# Patient Record
Sex: Male | Born: 1985 | Race: Black or African American | Hispanic: No | Marital: Single | Smoking: Never smoker
Health system: Southern US, Community
[De-identification: ages and names within clinical notes are randomized; demographics above are authoritative.]

## PROBLEM LIST (undated history)

## (undated) DIAGNOSIS — E7800 Pure hypercholesterolemia, unspecified: Secondary | ICD-10-CM

## (undated) DIAGNOSIS — E785 Hyperlipidemia, unspecified: Secondary | ICD-10-CM

## (undated) DIAGNOSIS — E669 Obesity, unspecified: Secondary | ICD-10-CM

## (undated) HISTORY — DX: Obesity, unspecified: E66.9

## (undated) HISTORY — DX: Hyperlipidemia, unspecified: E78.5

---

## 2009-11-17 ENCOUNTER — Ambulatory Visit: Payer: Self-pay | Admitting: Cardiology

## 2009-11-17 ENCOUNTER — Encounter: Payer: Self-pay | Admitting: Physician Assistant

## 2009-11-17 ENCOUNTER — Inpatient Hospital Stay (HOSPITAL_COMMUNITY): Admission: EM | Admit: 2009-11-17 | Discharge: 2009-11-19 | Payer: Self-pay | Admitting: Emergency Medicine

## 2009-11-18 ENCOUNTER — Encounter: Payer: Self-pay | Admitting: Physician Assistant

## 2009-11-22 ENCOUNTER — Encounter: Payer: Self-pay | Admitting: Physician Assistant

## 2009-11-22 DIAGNOSIS — E785 Hyperlipidemia, unspecified: Secondary | ICD-10-CM

## 2009-11-22 DIAGNOSIS — E119 Type 2 diabetes mellitus without complications: Secondary | ICD-10-CM | POA: Insufficient documentation

## 2009-11-22 DIAGNOSIS — E669 Obesity, unspecified: Secondary | ICD-10-CM

## 2009-11-30 ENCOUNTER — Ambulatory Visit: Payer: Self-pay | Admitting: Internal Medicine

## 2009-11-30 ENCOUNTER — Encounter: Payer: Self-pay | Admitting: Physician Assistant

## 2009-11-30 LAB — CONVERTED CEMR LAB
ALT: 39 units/L (ref 0–53)
Albumin: 4.3 g/dL (ref 3.5–5.2)
Blood Glucose, Fingerstick: 147
CO2: 24 meq/L (ref 19–32)
Chloride: 102 meq/L (ref 96–112)
Glucose, Bld: 144 mg/dL — ABNORMAL HIGH (ref 70–99)
Potassium: 4.4 meq/L (ref 3.5–5.3)
Sodium: 139 meq/L (ref 135–145)
Total Bilirubin: 0.5 mg/dL (ref 0.3–1.2)
Total Protein: 7 g/dL (ref 6.0–8.3)

## 2009-12-01 ENCOUNTER — Encounter: Payer: Self-pay | Admitting: Physician Assistant

## 2009-12-02 ENCOUNTER — Encounter: Payer: Self-pay | Admitting: Physician Assistant

## 2010-04-27 ENCOUNTER — Ambulatory Visit: Payer: Self-pay | Admitting: Physician Assistant

## 2010-04-27 LAB — CONVERTED CEMR LAB
Bilirubin Urine: NEGATIVE
Blood Glucose, Fingerstick: 169
Blood in Urine, dipstick: NEGATIVE
Glucose, Urine, Semiquant: NEGATIVE
Ketones, urine, test strip: NEGATIVE
Protein, U semiquant: NEGATIVE
WBC Urine, dipstick: NEGATIVE

## 2010-04-29 LAB — CONVERTED CEMR LAB
ALT: 28 units/L (ref 0–53)
AST: 20 units/L (ref 0–37)
Alkaline Phosphatase: 56 units/L (ref 39–117)
Basophils Absolute: 0.1 10*3/uL (ref 0.0–0.1)
CO2: 22 meq/L (ref 19–32)
Creatinine, Ser: 1.11 mg/dL (ref 0.40–1.50)
HCT: 47.4 % (ref 39.0–52.0)
Hemoglobin: 15.5 g/dL (ref 13.0–17.0)
Hgb A1c MFr Bld: 8.2 % — ABNORMAL HIGH (ref <5.7)
Lymphocytes Relative: 40 % (ref 12–46)
Lymphs Abs: 2.5 10*3/uL (ref 0.7–4.0)
Neutro Abs: 3.1 10*3/uL (ref 1.7–7.7)
Platelets: 273 10*3/uL (ref 150–400)
Potassium: 4.5 meq/L (ref 3.5–5.3)
RDW: 14 % (ref 11.5–15.5)
Sodium: 139 meq/L (ref 135–145)
Total Bilirubin: 0.5 mg/dL (ref 0.3–1.2)
Total Protein: 7.6 g/dL (ref 6.0–8.3)
WBC: 6.3 10*3/uL (ref 4.0–10.5)

## 2010-05-02 ENCOUNTER — Telehealth: Payer: Self-pay | Admitting: Physician Assistant

## 2010-05-03 ENCOUNTER — Ambulatory Visit: Payer: Self-pay | Admitting: Physician Assistant

## 2010-05-31 ENCOUNTER — Ambulatory Visit: Payer: Self-pay | Admitting: Physician Assistant

## 2010-07-08 ENCOUNTER — Encounter: Payer: Self-pay | Admitting: Physician Assistant

## 2010-07-28 ENCOUNTER — Ambulatory Visit: Payer: Self-pay | Admitting: Physician Assistant

## 2010-07-28 LAB — CONVERTED CEMR LAB: Blood Glucose, Fingerstick: 245

## 2010-08-09 ENCOUNTER — Encounter (INDEPENDENT_AMBULATORY_CARE_PROVIDER_SITE_OTHER): Payer: Self-pay | Admitting: *Deleted

## 2010-08-12 ENCOUNTER — Ambulatory Visit: Payer: Self-pay | Admitting: Internal Medicine

## 2010-08-15 LAB — CONVERTED CEMR LAB: HDL: 46 mg/dL (ref >39)

## 2010-09-09 ENCOUNTER — Telehealth (INDEPENDENT_AMBULATORY_CARE_PROVIDER_SITE_OTHER): Payer: Self-pay | Admitting: Internal Medicine

## 2010-09-09 ENCOUNTER — Encounter (INDEPENDENT_AMBULATORY_CARE_PROVIDER_SITE_OTHER): Payer: Self-pay | Admitting: *Deleted

## 2010-09-20 ENCOUNTER — Ambulatory Visit: Payer: Self-pay | Admitting: Internal Medicine

## 2010-11-22 NOTE — Assessment & Plan Note (Signed)
Summary: type 2 dm/ gk   Vital Signs:  Patient profile:   25 year old male Height:      71 inches Weight:      272.5 pounds BMI:     38.14 Temp:     97.7 degrees F oral Pulse rate:   75 / minute Pulse rhythm:   regular Resp:     18 per minute BP sitting:   126 / 91  (left arm) Cuff size:   large  Vitals Entered By: [place] Shannon (April 27, 2010 9:08 AM) CC: follow-up visit.... metformin is making pt go to bathroom alot... (diarrhea).... Is Patient Diabetic? Yes Pain Assessment Patient in pain? no      CBG Result 169  Does patient need assistance? Ambulation Normal   Primary Care Provider:  Tereso Newcomer PA-C  CC:  follow-up visit.... metformin is making pt go to bathroom alot... (diarrhea).....  History of Present Illness: Here for f/u on DM.  Not seen since Feb.  No f/u arranged until now.  DM:  Sugars at home 130-170.  Had one reading over 200 in last several months.  No polyuria or polydipsia.  No foot problems.  Needs retasure.  Having issues with Metformin.  He states that he is having diarrhea every time he takes the medicine.  He has not been taking on a regular basis.  Actually cut back to once daily a few months ago.  There are some days he does not take.  He states there are more days a week that he does NOT take the Metformin.  Still using Lantus 40 units at bedtime and Novolog SSI.  Uses 6 units + sliding scale given to him in the hospital.  No hypoglycemic episodes.  High chol:  Still taking Niacin and SImvastatin 40.  Fasting today.   Allergies (verified): No Known Drug Allergies  Comments:  Nurse/Medical Assistant: refill on insulin.... pt did not bring meds today and he is advise to bring meds to every appointment Armenia Shannon (April 27, 2010 9:20 AM)  Review of Systems      See HPI General:  Denies chills and fever. Eyes:  Denies blurring. CV:  Denies chest pain or discomfort, fainting, and shortness of breath with exertion. Resp:  Denies  cough. GI:  See HPI.  Physical Exam  General:  alert, well-developed, and well-nourished.   Head:  normocephalic and atraumatic.   Eyes:  pupils equal, pupils round, pupils reactive to light, and no optic disk abnormalities.   Neck:  supple.   Lungs:  normal breath sounds.   Heart:  normal rate and regular rhythm.   Msk:  no foot deformity  Extremities:  no edema bilat feet without callus or ulcer  Neurologic:  alert & oriented X3 and cranial nerves II-XII intact.   Psych:  normally interactive.    Diabetes Management Exam:    Foot Exam (with socks and/or shoes not present):       Sensory-Monofilament:          Left foot: normal          Right foot: normal   Impression & Recommendations:  Problem # 1:  DIABETES MELLITUS, TYPE II (ICD-250.00) decrease metformin to 1/2 tab two times a day if tolerates, continue 500 mg two times a day if not, d/c completely  His updated medication list for this problem includes:    Lantus 100 Unit/ml Soln (Insulin glargine) ..... Inject 40 units at bedtime    Metformin  Hcl 500 Mg Tabs (Metformin hcl) .Marland Kitchen... Take 1 tablet by mouth two times a day    Novolog 100 Unit/ml Soln (Insulin aspart) ..... Inject 6 units with each meal plus extra as indicated by sliding scale (resistant scale)  Orders: T-Comprehensive Metabolic Panel ([address]-29562) T-Urine Microalbumin w/creat. ratio [id number]) UA Dipstick w/o Micro (manual) (52841) Capillary Blood Glucose/CBG (82948) T- Hemoglobin A1C (32440-10272)  Problem # 2:  HYPERLIPIDEMIA (ICD-272.4) recheck FLP today  His updated medication list for this problem includes:    Simvastatin 40 Mg Tabs (Simvastatin) .Marland Kitchen... Take 1 tab by mouth at bedtime for cholesterol    Niacin Cr 500 Mg Cr-tabs (Niacin) .Marland Kitchen... Take 1 tab by mouth at bedtime for cholesterol    Niacin Cr 1000 Mg Cr-tabs (Niacin) .Marland Kitchen... Take 1 tab by mouth at bedtime for cholesterol.  do not start until after you finish the prescription for  the 500 mg tablets.  Orders: T-Comprehensive Metabolic Panel [phone]) T-Lipid Profile [phone])  Problem # 3:  OBESITY (ICD-278.00) see Susie Piper  Complete Medication List: 1)  Lantus 100 Unit/ml Soln (Insulin glargine) .... Inject 40 units at bedtime 2)  Metformin Hcl 500 Mg Tabs (Metformin hcl) .... Take 1 tablet by mouth two times a day 3)  Novolog 100 Unit/ml Soln (Insulin aspart) .... Inject 6 units with each meal plus extra as indicated by sliding scale (resistant scale) 4)  Simvastatin 40 Mg Tabs (Simvastatin) .... Take 1 tab by mouth at bedtime for cholesterol 5)  Multivitamins Tabs (Multiple vitamin) .... Take 1 tablet by mouth once a day 6)  Insulin Syringes  .... Inject lantus once daily and novolog three times a day as directed 7)  Niacin Cr 500 Mg Cr-tabs (Niacin) .... Take 1 tab by mouth at bedtime for cholesterol 8)  Niacin Cr 1000 Mg Cr-tabs (Niacin) .... Take 1 tab by mouth at bedtime for cholesterol.  do not start until after you finish the prescription for the 500 mg tablets.  Patient Instructions: 1)  [facility] clinic. 2)  Decrease Metformin to 1000 mg take 1/2 tablet two times a day. 3)  If you still have diarrhea with this, stop it completely. 4)  Notify me what you do with the Metformin. 5)  Schedule appointment with Drucilla Schmidt for diet education. 6)  Please schedule a follow-up appointment in 3 months with Jarmarcus Wambold for diabetes.  Prescriptions: NOVOLOG 100 UNIT/ML SOLN (INSULIN ASPART) inject 6 units with each meal plus extra as indicated by sliding scale (resistant scale)  #1 mo. supply x 11   Entered and Authorized by:   Tereso Newcomer PA-C   Signed by:   Tereso Newcomer PA-C on 04/27/2010   Method used:   Print then Give to Patient   RxID:   [id number] LANTUS 100 UNIT/ML SOLN (INSULIN GLARGINE) inject 40 units at bedtime  #1 mo supply x 11   Entered and Authorized by:   Tereso Newcomer PA-C   Signed by:   Tereso Newcomer PA-C on  04/27/2010   Method used:   Print then Give to Patient   RxID:   [id number] METFORMIN HCL 500 MG TABS (METFORMIN HCL) Take 1 tablet by mouth two times a day  #60 x 5   Entered and Authorized by:   Tereso Newcomer PA-C   Signed by:   Tereso Newcomer PA-C on 04/27/2010   Method used:   Print then Give to Patient   RxID:   [id number]  Diabetic Foot Exam Last Podiatry Exam Date: 04/27/2010  Foot Inspection Is there a history of a foot ulcer?              No Is there a foot ulcer now?              No Can the patient see the bottom of their feet?          Yes Are the shoes appropriate in style and fit?          Yes Is there swelling or an abnormal foot shape?          No Are the toenails long?                No Are the toenails thick?                No Are the toenails ingrown?              No Is there heavy callous build-up?              No Is there a claw toe deformity?                          No Is there elevated skin temperature?            No Is there limited ankle dorsiflexion?            No Is there foot or ankle muscle weakness?            No Do you have pain in calf while walking?           No         10-g (5.07) Semmes-Weinstein Monofilament Test Performed by: Armenia Shannon          Right Foot          Left Foot Visual Inspection               Test Control      normal         normal Site 1         normal         normal Site 2         normal         normal Site 3         normal         normal Site 4         normal         normal Site 5         normal         normal Site 6         normal         normal Site 7         normal         normal Site 8         normal         normal Site 9         normal         normal Site 10         normal         normal  Impression      normal         normal  Laboratory Results   Urine Tests    Routine Urinalysis   Glucose: negative   (Normal Range: Negative) Bilirubin:  negative   (Normal Range:  Negative) Ketone: negative   (Normal Range: Negative) Spec. Gravity: 1.025   (Normal Range: 1.003-1.035) Blood: negative   (Normal Range: Negative) pH: 5.0   (Normal Range: 5.0-8.0) Protein: negative   (Normal Range: Negative) Urobilinogen: 0.2   (Normal Range: 0-1) Nitrite: negative   (Normal Range: Negative) Leukocyte Esterace: negative   (Normal Range: Negative)     Blood Tests     CBG Random:: 169mg /dL

## 2010-11-22 NOTE — Letter (Signed)
Summary: NUTRITIONIST SUMMARY  NUTRITIONIST SUMMARY   Imported By: Arta Bruce 06/20/2010 16:51:02  _____________________________________________________________________  External Attachment:    Type:   Image     Comment:   External Document

## 2010-11-22 NOTE — Letter (Signed)
Summary: *HSN Results Follow up  [facility]  [address] [place], [place] [address]   Phone: [phone]  Fax: [phone]      09/09/2010   Anish ERIC Mccready [address] [place], [place]  [address]   Dear  Mr. SIEGFRIED VIETH,                            ____S.Drinkard,FNP   ____D. Gore,FNP       ____B. McPherson,MD   ____V. Rankins,MD    __X__E. Mulberry,MD    ____N. Daphine Deutscher, FNP  ____D. Reche Dixon, MD    ____K. Philipp Deputy, MD    ____Other     This letter is to inform you that your recent test(s):  _______Pap Smear    _______Lab Test     _______X-ray    _______ is within acceptable limits  _______ requires a medication change  _______ requires a follow-up lab visit  _______ requires a follow-up visit with your provider   Comments: I BEEN TRYING TO REACH AND THE NUTRIRIONIST CLINIC TOO .                   PLEASE CALL [phone] TO MAKE AN APPT WITH THE NUTRITIONIST                   THANK YOU        _________________________________________________________ If you have any questions, please contact our office                     Sincerely,  Cheryll Dessert [facility]

## 2010-11-22 NOTE — Letter (Signed)
Summary: *HSN Results Follow up  [facility]  [address] [place], [place] [address]   Phone: [phone]  Fax: [phone]      07/08/2010   Vonnie ERIC Priestly [address] [place], [place]  [address]   Dear  Mr. GERSHOM BROBECK,                            ____S.Drinkard,FNP   ____D. Gore,FNP       ____B. McPherson,MD   ____V. Rankins,MD    ____E. Mulberry,MD    ____N. Daphine Deutscher, FNP  ____D. Reche Dixon, MD    ____K. Philipp Deputy, MD    __x__S. Alben Spittle, PA-C     This letter is to inform you that your recent test(s):  _______Pap Smear    _______Lab Test     _______X-ray    _______ is within acceptable limits  _______ requires a medication change  _______ requires a follow-up lab visit  _______ requires a follow-up visit with your Neida Ellegood   Comments:Eye test for diabetes was normal.       _________________________________________________________ If you have any questions, please contact our office                     Sincerely,  Tereso Newcomer PA-C [facility]

## 2010-11-22 NOTE — Letter (Signed)
Summary: *HSN Results Follow up  [facility]  [address] [place], [place] [address]   Phone: [phone]  Fax: [phone]      12/02/2009   Ulyses ERIC Hebel [address] [place], [place]  [address]   Dear  Mr. KHOLTON COATE,                            ____S.Drinkard,FNP   ____D. Gore,FNP       ____B. McPherson,MD   ____V. Rankins,MD    ____E. Mulberry,MD    ____N. Daphine Deutscher, FNP  ____D. Reche Dixon, MD    ____K. Philipp Deputy, MD    __x__S. Alben Spittle, PA-C     This letter is to inform you that your recent test(s):  _______Pap Smear    ____x___Lab Test     _______X-ray    ___x____ is within acceptable limits  _______ requires a medication change  _______ requires a follow-up lab visit  _______ requires a follow-up visit with your provider   Comments:       _________________________________________________________ If you have any questions, please contact our office                     Sincerely,  Tereso Newcomer PA-C [facility]

## 2010-11-22 NOTE — Letter (Signed)
Summary: *HSN Results Follow up  [facility]  [address] [place], [place] [address]   Phone: [phone]  Fax: [phone]      08/09/2010   Ian Huff [address] [place], [place]  [address]   Dear  [name],                            ____S.Drinkard,FNP   ____D. Gore,FNP       ____B. McPherson,MD   ____V. Rankins,MD    ____E. Mulberry,MD    ____N. Daphine Deutscher, FNP  ____D. Reche Dixon, MD    ____K. Philipp Deputy, MD    ____Other     This letter is to inform you that your recent test(s):  _______Pap Smear    _______Lab Test     _______X-ray    _______ is within acceptable limits  _______ requires a medication change  _______ requires a follow-up lab visit  _______ requires a follow-up visit with your [name]   Comments:  We have been trying to reach you.  Please call the office at your earliest convenience.       _________________________________________________________ If you have any questions, please contact our office                     Sincerely,  [place] Shannon [facility]

## 2010-11-22 NOTE — Assessment & Plan Note (Signed)
Summary: XFU-NEW ONSET DM//DS   Vital Signs:  Patient profile:   25 year old male Height:      71 inches Weight:      257 pounds BMI:     35.97 Temp:     98.0 degrees F oral Pulse rate:   89 / minute Pulse rhythm:   regular Resp:     18 per minute BP sitting:   118 / 81  (left arm) Cuff size:   large  Vitals Entered By: [place] Shannon (November 30, 2009 12:08 PM) CC: xf/u dm.... pt says his eyes are blurry.... pt does need rx on meds... Is Patient Diabetic? Yes Pain Assessment Patient in pain? no      CBG Result 147  Does patient need assistance? Functional Status Self care Ambulation Normal   CC:  xf/u dm.... pt says his eyes are blurry.... pt does need rx on meds....  History of Present Illness: 25 year old male who presents for post physician followup.  He presented with new onset diabetes mellitus on 11/17/09.  He was noted to have diabetic ketoacidosis as well as acute renal insufficiency secondary to dehydration and oral thrush.  He had hyponatremia and hypokalemia related to his presenting situation.  With correction of his sugars and hydration his electrolytes improved as well as his creatinine.  He was placed on metformin as well as Lantus and NovoLog sliding scale insulin.  He had a anti-glutamic acid decarboxylase level obtained which was normal and indicated that he is a type II diabetic.  He was also noted to have significant dyslipidemia with triglyceride level over 1000 and a total cholesterol of 620.  He was placed on simvastatin as well as Fish oil.  The patient notes that he has been unable to take Fish oil secondary to the size of the tablets.  He has been out of his simvastatin and metformin for a few days now.  He didn't checking his sugars at home and they have been below 200 the majority of the time.  Since being home, he has felt well.  He denies any further dry mouth or polyuria.  He was treated for oral thrush and the lesions inside his mouth clear.  He  denies nausea vomiting or diarrhea.  He understands that he uses sliding scale insulin.  Habits & Providers  Alcohol-Tobacco-Diet     Tobacco Status: never  Exercise-Depression-Behavior     Drug Use: no  Current Medications (verified): 1)  Lantus 100 Unit/ml Soln (Insulin Glargine) .... Inject 40 Units At Bedtime 2)  Metformin Hcl 1000 Mg Tabs (Metformin Hcl) .... Take 1 Tablet By Mouth Two Times A Day For Diabetes 3)  Novolog 100 Unit/ml Soln (Insulin Aspart) .... Inject 6 Units With Each Meal or As Directed Per Sliding Scale 4)  Simvastatin 40 Mg Tabs (Simvastatin) .... Take 1 Tab By Mouth At Bedtime For Cholesterol 5)  Lovaza 1 Gm Caps (Omega-3-Acid Ethyl Esters) .... Take 1 Tablet By Mouth Four Times A Day For Cholesterol 6)  Multivitamins  Tabs (Multiple Vitamin) .... Take 1 Tablet By Mouth Once A Day  Allergies (verified): No Known Drug Allergies  Family History: Reviewed history from 11/22/2009 and no changes required. Family History Diabetes 1st degree relative (mom)  Social History: Occupation: unemployed Was in school for bus. admin.; plans to go back Single Never Smoked Alcohol use-no Drug use-no Occupation:  employed Smoking Status:  never Drug Use:  no  Review of Systems      See  HPI General:  Denies chills and fever. Eyes:  Complains of blurring. CV:  Denies chest pain or discomfort. Resp:  Denies shortness of breath. GI:  Denies bloody stools. GU:  Denies hematuria.  Physical Exam  General:  alert, well-developed, and well-nourished.   Head:  normocephalic and atraumatic.   Eyes:  pupils equal, pupils round, pupils reactive to light, and no retinal abnormalitiies.   Mouth:  pharynx pink and moist.   Neck:  supple and no carotid bruits.   Lungs:  normal breath sounds, no crackles, and no wheezes.   Heart:  normal rate, regular rhythm, and no murmur.   Abdomen:  soft, non-tender, normal bowel sounds, and no hepatomegaly.   Neurologic:  alert &  oriented X3 and cranial nerves II-XII intact.   Psych:  normally interactive.     Impression & Recommendations:  Problem # 1:  DIABETES MELLITUS, TYPE II (ICD-250.00)  sliding scale is resistant scale from hosp . . . so he takes 6 units of novolog with each meal + sliding scale (if indicated) <120 . . . nothing 121-150. . . 3 units 151-200. . . 4 201-250. . . 7 251-300. . . 11 301-350. . . 15 351-400. . . 20 >400. . . call  A1C was 14.4 in the hosp. recheck again in 4-6 weeks schedule retasure . . . c/o blurry vision at times obtain microalbumin and monofilament at next visit    His updated medication list for this problem includes:    Lantus 100 Unit/ml Soln (Insulin glargine) ..... Inject 40 units at bedtime    Metformin Hcl 1000 Mg Tabs (Metformin hcl) .Marland Kitchen... Take 1 tablet by mouth two times a day for diabetes    Novolog 100 Unit/ml Soln (Insulin aspart) ..... Inject 6 units with each meal plus extra as indicated by sliding scale (resistant scale)  Orders: Diabetic Clinic Referral (Diabetic) T-Comprehensive Metabolic Panel ([id number])  Problem # 2:  HYPERLIPIDEMIA (ICD-272.4)  not taking lovaza due to size of pills change to niacin check labs at f/u visit advised him to take asa 30 mins before and warned him of possibility of flushing  The following medications were removed from the medication list:    Lovaza 1 Gm Caps (Omega-3-acid ethyl esters) .Marland Kitchen... Take 1 tablet by mouth four times a day for cholesterol His updated medication list for this problem includes:    Simvastatin 40 Mg Tabs (Simvastatin) .Marland Kitchen... Take 1 tab by mouth at bedtime for cholesterol    Niacin Cr 500 Mg Cr-tabs (Niacin) .Marland Kitchen... Take 1 tab by mouth at bedtime for cholesterol    Niacin Cr 1000 Mg Cr-tabs (Niacin) .Marland Kitchen... Take 1 tab by mouth at bedtime for cholesterol.  do not start until after you finish the prescription for the 500 mg tablets.  Orders: T-Comprehensive Metabolic Panel  ([id number])  Problem # 3:  Preventive Health Care (ICD-V70.0) flu, Td, Pnuemovax all up to date wants to hold off on getting HIV until next lab visit TSH in the hosp ok UDS was also ok CBC with normal hgb in hosp.  Complete Medication List: 1)  Lantus 100 Unit/ml Soln (Insulin glargine) .... Inject 40 units at bedtime 2)  Metformin Hcl 1000 Mg Tabs (Metformin hcl) .... Take 1 tablet by mouth two times a day for diabetes 3)  Novolog 100 Unit/ml Soln (Insulin aspart) .... Inject 6 units with each meal plus extra as indicated by sliding scale (resistant scale) 4)  Simvastatin 40 Mg Tabs (Simvastatin) .... Take 1  tab by mouth at bedtime for cholesterol 5)  Multivitamins Tabs (Multiple vitamin) .... Take 1 tablet by mouth once a day 6)  Insulin Syringes  .... Inject lantus once daily and novolog three times a day as directed 7)  Niacin Cr 500 Mg Cr-tabs (Niacin) .... Take 1 tab by mouth at bedtime for cholesterol 8)  Niacin Cr 1000 Mg Cr-tabs (Niacin) .... Take 1 tab by mouth at bedtime for cholesterol.  do not start until after you finish the prescription for the 500 mg tablets.  Patient Instructions: 1)  Please schedule appointment for eligibility. 2)  Schedule retasure at [facility] Clinic. 3)  Take Aspirin 81 mg once daily about 30 minutes before you take the niacin. 4)  Niacin will replace the fish oil tablets. 5)  Please schedule a follow-up appointment in 1 month with Hamad Whyte for diabetes. 6)  Schedule appt with Susie Piper. 7)    Prescriptions: NIACIN CR 1000 MG CR-TABS (NIACIN) Take 1 tab by mouth at bedtime for cholesterol.  Do not start until after you finish the prescription for the 500 mg tablets.  #30 x 5   Entered and Authorized by:   Tereso Newcomer PA-C   Signed by:   Tereso Newcomer PA-C on 11/30/2009   Method used:   Print then Give to Patient   RxID:   [id number] NIACIN CR 500 MG CR-TABS (NIACIN) Take 1 tab by mouth at bedtime for cholesterol  #30 x 0   Entered and  Authorized by:   Tereso Newcomer PA-C   Signed by:   Tereso Newcomer PA-C on 11/30/2009   Method used:   Print then Give to Patient   RxID:   [id number] INSULIN SYRINGES Inject Lantus once daily and novolog three times a day as directed  #1 mo. supply x 11   Entered and Authorized by:   Tereso Newcomer PA-C   Signed by:   Tereso Newcomer PA-C on 11/30/2009   Method used:   Print then Give to Patient   RxID:   [id number] MULTIVITAMINS  TABS (MULTIPLE VITAMIN) Take 1 tablet by mouth once a day  #30 x 11   Entered and Authorized by:   Tereso Newcomer PA-C   Signed by:   Tereso Newcomer PA-C on 11/30/2009   Method used:   Print then Give to Patient   RxID:   [id number] SIMVASTATIN 40 MG TABS (SIMVASTATIN) Take 1 tab by mouth at bedtime for cholesterol  #30 x 5   Entered and Authorized by:   Tereso Newcomer PA-C   Signed by:   Tereso Newcomer PA-C on 11/30/2009   Method used:   Print then Give to Patient   RxID:   [id number] NOVOLOG 100 UNIT/ML SOLN (INSULIN ASPART) inject 6 units with each meal plus extra as indicated by sliding scale (resistant scale)  #1 mo. supply x 11   Entered and Authorized by:   Tereso Newcomer PA-C   Signed by:   Tereso Newcomer PA-C on 11/30/2009   Method used:   Print then Give to Patient   RxID:   [id number] METFORMIN HCL 1000 MG TABS (METFORMIN HCL) Take 1 tablet by mouth two times a day for diabetes  #60 x 5   Entered and Authorized by:   Tereso Newcomer PA-C   Signed by:   Tereso Newcomer PA-C on 11/30/2009   Method used:   Print then Give to Patient   RxID:   [id number] LANTUS 100 UNIT/ML SOLN (INSULIN GLARGINE) inject 40 units  at bedtime  #1 mo supply x 11   Entered and Authorized by:   Tereso Newcomer PA-C   Signed by:   Tereso Newcomer PA-C on 11/30/2009   Method used:   Print then Give to Patient   RxID:   [id number]

## 2010-11-22 NOTE — Progress Notes (Signed)
Summary: NEEDS REFILL ON LANTUS & STRIPS  Phone Note Call from Patient Call back at Home Phone [phone]   Reason for Call: Refill Medication Summary of Call: Icholas Irby PT. MR Romig SAYS THAT HE HAS NEVER USED OUR PHARMACY, BUT HE NEED REFILL ON HIS LANTUS. AND HE NEEDS REFILL ON HIS TEST STRIPS (RELION STRIPS #20) THESE HE PURCHASED AT [facility] Initial call taken by: Leodis Rains,  May 02, 2010 2:35 PM  Follow-up for Phone Call        forward to provider Follow-up by: [place] Shannon,  May 03, 2010 8:41 AM  Additional Follow-up for Phone Call Additional follow up Details #1::        [facility] Additional Follow-up by: Tereso Newcomer PA-C,  May 03, 2010 9:05 PM    Additional Follow-up for Phone Call Additional follow up Details #2::    Left message on answering machine for pt to call back.Marland KitchenMarland KitchenArmenia Shannon  May 04, 2010 10:36 AM   [facility]  Follow-up by: Arm[place] Shannon,  May 04, 2010 4:03 PM  Additional Follow-up for Phone Call Additional follow up Details #3:: Details for Additional Follow-up Action Taken: Left message on voicemail to return call.  Dutch Quint RN  May 05, 2010 12:40 PM  Lantus called  to [facility], they do not carry Relion strips will need to find out which [facility]. Left message on answer machine for pt. to return call. Gaylyn Cheers RN  May 06, 2010 10:02 AM   Rx for strips [facility] per pt. request.     Additional Follow-up by: Dutch Quint RN,  May 06, 2010 11:36 AM  New/Updated Medications: RELION BLOOD GLUCOSE TEST  STRP (GLUCOSE BLOOD) check sugar three times a day Prescriptions: RELION BLOOD GLUCOSE TEST  STRP (GLUCOSE BLOOD) check sugar three times a day  #90 x 11   Entered and Authorized by:   Tereso Newcomer PA-C   Signed by:   Tereso Newcomer PA-C on 05/04/2010   Method used:   Faxed to ...       [facility]  (retail)       [address] [place], [place]  [address]       Ph: [phone]       Fax: [phone]   RxID:   [id number] LANTUS 100 UNIT/ML SOLN (INSULIN GLARGINE) inject 50 units at bedtime  #1 mo supply x 11   Entered and Authorized by:   Tereso Newcomer PA-C   Signed by:   Tereso Newcomer PA-C on 05/04/2010   Method used:   Faxed to ...       [facility] (retail)       [address] [place], [place]  [address]       Ph: [phone]       Fax: [phone]   RxID:   [id number]

## 2010-11-22 NOTE — Miscellaneous (Signed)
  Clinical Lists Changes  Observations: Added new observation of DIAB EYE EX: retasure normal (05/03/2010 15:01)

## 2010-11-22 NOTE — Letter (Signed)
Summary: PT INFORMATION SHEET  PT INFORMATION SHEET   Imported By: Arta Bruce 01/19/2010 15:03:50  _____________________________________________________________________  External Attachment:    Type:   Image     Comment:   External Document

## 2010-11-22 NOTE — Assessment & Plan Note (Signed)
Summary: Diabetes   Vital Signs:  Patient profile:   25 year old male Height:      71 inches Weight:      270.8 pounds BMI:     37.91 Temp:     98.2 degrees F oral Pulse rate:   76 / minute Pulse rhythm:   regular Resp:     18 per minute BP sitting:   126 / 80  (left arm) Cuff size:   regular  Vitals Entered By: CMA Student Linzie Collin CC: follow-up visit, DM, patient isn't fasting Is Patient Diabetic? Yes Pain Assessment Patient in pain? no      CBG Result 245  Does patient need assistance? Functional Status Self care Ambulation Normal   Primary Care Provider:  Tereso Newcomer PA-C  CC:  follow-up visit, DM, and patient isn't fasting.  History of Present Illness: Here for f/u on DM.  Diet is suspect.  Eats a lot of chips.  Eats at least every other day.  Does not eat fast food often.  Sugars on his meter run anywhere from 150-300.  Most avg somewhere around 190-220.  No nausea, blurry vision, polyuria or polydipsia.  Taking Lantus 50 units at bedtime.  Usually takes about 10 units of Novolog with each meal.  No foot sores or numbness or tingling.  Now off metformin.     Problems Prior to Update: 1)  Family History Diabetes 1st Degree Relative  (ICD-V18.0) 2)  Obesity  (ICD-278.00) 3)  Hyperlipidemia  (ICD-272.4) 4)  Diabetes Mellitus, Type II  (ICD-250.00)  Current Medications (verified): 1)  Lantus 100 Unit/ml Soln (Insulin Glargine) .... Inject 50 Units At Bedtime 2)  Metformin Hcl 500 Mg Tabs (Metformin Hcl) .... Take 1 Tablet By Mouth Two Times A Day 3)  Novolog 100 Unit/ml Soln (Insulin Aspart) .... Inject 6 Units With Each Meal Plus Extra As Indicated By Sliding Scale (Resistant Scale) 4)  Simvastatin 40 Mg Tabs (Simvastatin) .... Take 1 Tab By Mouth At Bedtime For Cholesterol 5)  Multivitamins  Tabs (Multiple Vitamin) .... Take 1 Tablet By Mouth Once A Day 6)  Insulin Syringes .... Inject Lantus Once Daily and Novolog Three Times A Day As Directed 7)   Niacin Cr 500 Mg Cr-Tabs (Niacin) .... Take 1 Tab By Mouth At Bedtime For Cholesterol 8)  Niacin Cr 1000 Mg Cr-Tabs (Niacin) .... Take 1 Tab By Mouth At Bedtime For Cholesterol.  Do Not Start Until After You Finish The Prescription For The 500 Mg Tablets. 9)  Relion Blood Glucose Test  Strp (Glucose Blood) .... Check Sugar Three Times A Day  Allergies (verified): No Known Drug Allergies  Past History:  Past Medical History: Last updated: 11/22/2009 Diabetes mellitus, type II   a.  Admx with DKA 10/2009 with ARF, dehydration and hypoNa assoc.   b.  Anti-glutamic decarboxylase Abs negative Hyperlipidemia   a. TC 620; TRIG 1765   b.  Simvastatin 40 and Lovaza 1 gm 4x/day started at d/c 10/2009 from [facility]   c.  f/u FLP and LFTs needed late Feb or early Mar 2011 Obesity  Physical Exam  General:  alert, well-developed, and well-nourished.   Head:  normocephalic and atraumatic.   Eyes:  pupils equal, pupils round, and pupils reactive to light.   Lungs:  normal breath sounds.   Heart:  normal rate and regular rhythm.   Abdomen:  soft, non-tender, normal bowel sounds, and no hepatomegaly.   Neurologic:  alert & oriented X3 and cranial nerves  II-XII intact.   Psych:  normally interactive.    Diabetes Management Exam:    Foot Exam (with socks and/or shoes not present):       Inspection:          Left foot: normal          Right foot: normal       Nails:          Left foot: normal          Right foot: normal   Impression & Recommendations:  Problem # 1:  DIABETES MELLITUS, TYPE II (ICD-250.00) sugars appear uncontrolled diet is suspect advised him to watch diet increase Lantus to 60 units at bedtime check A1C may need to consider adding Actos or Januvia as adjunct to insulin in future . . d/w Sena Hitch   The following medications were removed from the medication list:    Metformin Hcl 500 Mg Tabs (Metformin hcl) .Marland Kitchen... Take 1 tablet by mouth two times a day His updated  medication list for this problem includes:    Lantus 100 Unit/ml Soln (Insulin glargine) ..... Inject 60 units at bedtime (dose increased)    Novolog 100 Unit/ml Soln (Insulin aspart) ..... Inject 6 units with each meal plus extra as indicated by sliding scale (resistant scale)  Orders: Capillary Blood Glucose/CBG ([address]) T- Hemoglobin A1C ([address])  Problem # 2:  HYPERLIPIDEMIA (ICD-272.4) schedule FLP  His updated medication list for this problem includes:    Simvastatin 40 Mg Tabs (Simvastatin) .Marland Kitchen... Take 1 tab by mouth at bedtime for cholesterol    Niacin Cr 500 Mg Cr-tabs (Niacin) .Marland Kitchen... Take 1 tab by mouth at bedtime for cholesterol    Niacin Cr 1000 Mg Cr-tabs (Niacin) .Marland Kitchen... Take 1 tab by mouth at bedtime for cholesterol.  do not start until after you finish the prescription for the 500 mg tablets.  Complete Medication List: 1)  Lantus 100 Unit/ml Soln (Insulin glargine) .... Inject 60 units at bedtime (dose increased) 2)  Novolog 100 Unit/ml Soln (Insulin aspart) .... Inject 6 units with each meal plus extra as indicated by sliding scale (resistant scale) 3)  Simvastatin 40 Mg Tabs (Simvastatin) .... Take 1 tab by mouth at bedtime for cholesterol 4)  Multivitamins Tabs (Multiple vitamin) .... Take 1 tablet by mouth once a day 5)  Insulin Syringes  .... Inject lantus once daily and novolog three times a day as directed 6)  Niacin Cr 500 Mg Cr-tabs (Niacin) .... Take 1 tab by mouth at bedtime for cholesterol 7)  Niacin Cr 1000 Mg Cr-tabs (Niacin) .... Take 1 tab by mouth at bedtime for cholesterol.  do not start until after you finish the prescription for the 500 mg tablets. 8)  Relion Blood Glucose Test Strp (Glucose blood) .... Check sugar three times a day  Patient Instructions: 1)  Schedule fasting lipids at your convenience in the next 1-2 weeks. 2)  Increase Lantus to 60 units at bedtime. 3)  I sent a new prescription to the pharmacy at [facility]. 4)  Watch your diet.   Avoid potato chips, soft drinks, etc. 5)  Increase your activity.  Walk 15 minutes in the morning and 15 minutes in the evening. 6)  Please schedule a follow-up appointment in 3 months for diabetes.  Prescriptions: LANTUS 100 UNIT/ML SOLN (INSULIN GLARGINE) inject 60 units at bedtime (dose increased)  #1 mo supply x 11   Entered and Authorized by:   Tereso Newcomer PA-C   Signed by:  Tereso Newcomer PA-C on 07/28/2010   Method used:   Faxed to ...       [facility] (retail)       [address] [place], [place]  [address]       Ph: [phone]       Fax: [phone]   RxID:   [id number]

## 2010-11-22 NOTE — Letter (Signed)
Summary: [facility]  [facility]   Imported By: Arta Bruce 07/11/2010 16:10:37  _____________________________________________________________________  External Attachment:    Type:   Image     Comment:   External Document

## 2010-11-22 NOTE — Progress Notes (Signed)
Summary: Huff ON TEST STRIPS  Phone Note Call from Patient Call back at Home Phone [phone]   Summary of Call: WEAVER PT. MR Ian Huff ON HIS WAVE SENSE STRIPS AND HE USES [facility] Initial call taken by: Leodis Rains,  September 09, 2010 3:47 PM  Follow-up for Phone Call        last ordered 05/04/10 with 11 refills. Pt. notified he just needs to contact Pharm and let them know he needs refills. Follow-up by: Gaylyn Cheers RN,  September 09, 2010 4:24 PM

## 2010-11-22 NOTE — Letter (Signed)
Summary: Work Excuse  [facility]  [address] [place], [place] [address]   Phone: [phone]  Fax: [phone]    Today's Date: July 28, 2010  Name of Patient: Ian Huff  The above named patient had a medical visit today at: 10  am / pm.  Please take this into consideration when reviewing the time away from school.    Special Instructions:  [ x ] None  [  ] To be off the remainder of today, returning to the normal work / school schedule tomorrow.  [  ] To be off until the next scheduled appointment on ______________________.  [  ] Other ________________________________________________________________ ________________________________________________________________________   Sincerely yours,   Tereso Newcomer PA-C

## 2011-01-09 LAB — BLOOD GAS, ARTERIAL
Acid-base deficit: 7.6 mmol/L — ABNORMAL HIGH (ref 0.0–2.0)
FIO2: 0.21 %
O2 Saturation: 95.8 %
Patient temperature: 98.6
TCO2: 15.7 mmol/L (ref 0–100)
pH, Arterial: 7.299 — ABNORMAL LOW (ref 7.350–7.450)

## 2011-01-09 LAB — DIFFERENTIAL
Basophils Absolute: 0 10*3/uL (ref 0.0–0.1)
Basophils Relative: 0 % (ref 0–1)
Eosinophils Absolute: 0 10*3/uL (ref 0.0–0.7)
Eosinophils Absolute: 0 10*3/uL (ref 0.0–0.7)
Eosinophils Relative: 0 % (ref 0–5)
Lymphocytes Relative: 12 % (ref 12–46)
Lymphs Abs: 1 10*3/uL (ref 0.7–4.0)
Monocytes Relative: 3 % (ref 3–12)
Neutro Abs: 8.6 10*3/uL — ABNORMAL HIGH (ref 1.7–7.7)
Neutrophils Relative %: 87 % — ABNORMAL HIGH (ref 43–77)

## 2011-01-09 LAB — LIPID PANEL: VLDL: UNDETERMINED mg/dL (ref 0–40)

## 2011-01-09 LAB — GLUCOSE, CAPILLARY
Glucose-Capillary: 140 mg/dL — ABNORMAL HIGH (ref 70–99)
Glucose-Capillary: 145 mg/dL — ABNORMAL HIGH (ref 70–99)
Glucose-Capillary: 151 mg/dL — ABNORMAL HIGH (ref 70–99)
Glucose-Capillary: 179 mg/dL — ABNORMAL HIGH (ref 70–99)
Glucose-Capillary: 211 mg/dL — ABNORMAL HIGH (ref 70–99)
Glucose-Capillary: 235 mg/dL — ABNORMAL HIGH (ref 70–99)
Glucose-Capillary: 273 mg/dL — ABNORMAL HIGH (ref 70–99)
Glucose-Capillary: 287 mg/dL — ABNORMAL HIGH (ref 70–99)
Glucose-Capillary: 292 mg/dL — ABNORMAL HIGH (ref 70–99)
Glucose-Capillary: 293 mg/dL — ABNORMAL HIGH (ref 70–99)
Glucose-Capillary: 339 mg/dL — ABNORMAL HIGH (ref 70–99)
Glucose-Capillary: >600 mg/dL (ref 70–99)

## 2011-01-09 LAB — BASIC METABOLIC PANEL
BUN: 12 mg/dL (ref 6–23)
BUN: 15 mg/dL (ref 6–23)
BUN: 16 mg/dL (ref 6–23)
BUN: 18 mg/dL (ref 6–23)
BUN: 24 mg/dL — ABNORMAL HIGH (ref 6–23)
CO2: 16 mEq/L — ABNORMAL LOW (ref 19–32)
CO2: 21 mEq/L (ref 19–32)
CO2: 24 mEq/L (ref 19–32)
CO2: 24 mEq/L (ref 19–32)
Calcium: 8.7 mg/dL (ref 8.4–10.5)
Calcium: 8.8 mg/dL (ref 8.4–10.5)
Calcium: 9.3 mg/dL (ref 8.4–10.5)
Calcium: 9.9 mg/dL (ref 8.4–10.5)
Chloride: 101 mEq/L (ref 96–112)
Chloride: 106 mEq/L (ref 96–112)
Chloride: 106 mEq/L (ref 96–112)
Chloride: 106 mEq/L (ref 96–112)
Chloride: 116 mEq/L — ABNORMAL HIGH (ref 96–112)
Creatinine, Ser: 1.34 mg/dL (ref 0.4–1.5)
Creatinine, Ser: 1.36 mg/dL (ref 0.4–1.5)
Creatinine, Ser: 1.36 mg/dL (ref 0.4–1.5)
Creatinine, Ser: 2 mg/dL — ABNORMAL HIGH (ref 0.4–1.5)
GFR calc Af Amer: >60 mL/min (ref >60)
GFR calc Af Amer: >60 mL/min (ref >60)
GFR calc Af Amer: >60 mL/min (ref >60)
GFR calc Af Amer: >60 mL/min (ref >60)
GFR calc Af Amer: >60 mL/min (ref >60)
GFR calc non Af Amer: 37 mL/min — ABNORMAL LOW (ref >60)
GFR calc non Af Amer: 54 mL/min — ABNORMAL LOW (ref >60)
GFR calc non Af Amer: >60 mL/min (ref >60)
GFR calc non Af Amer: >60 mL/min (ref >60)
GFR calc non Af Amer: >60 mL/min (ref >60)
GFR calc non Af Amer: >60 mL/min (ref >60)
Glucose, Bld: 1162 mg/dL (ref 70–99)
Glucose, Bld: 1480 mg/dL (ref 70–99)
Glucose, Bld: 260 mg/dL — ABNORMAL HIGH (ref 70–99)
Glucose, Bld: 265 mg/dL — ABNORMAL HIGH (ref 70–99)
Glucose, Bld: 288 mg/dL — ABNORMAL HIGH (ref 70–99)
Glucose, Bld: 364 mg/dL — ABNORMAL HIGH (ref 70–99)
Glucose, Bld: 381 mg/dL — ABNORMAL HIGH (ref 70–99)
Glucose, Bld: 418 mg/dL — ABNORMAL HIGH (ref 70–99)
Potassium: 3.2 mEq/L — ABNORMAL LOW (ref 3.5–5.1)
Potassium: 3.3 mEq/L — ABNORMAL LOW (ref 3.5–5.1)
Potassium: 3.4 mEq/L — ABNORMAL LOW (ref 3.5–5.1)
Potassium: 3.5 mEq/L (ref 3.5–5.1)
Potassium: 3.6 mEq/L (ref 3.5–5.1)
Potassium: 4.1 mEq/L (ref 3.5–5.1)
Sodium: 137 mEq/L (ref 135–145)
Sodium: 140 mEq/L (ref 135–145)
Sodium: 142 mEq/L (ref 135–145)
Sodium: 144 mEq/L (ref 135–145)

## 2011-01-09 LAB — URINALYSIS, ROUTINE W REFLEX MICROSCOPIC
Bilirubin Urine: NEGATIVE
Glucose, UA: >1000 mg/dL — AB
Hgb urine dipstick: NEGATIVE
Ketones, ur: 15 mg/dL — AB
pH: 5.5 (ref 5.0–8.0)

## 2011-01-09 LAB — CBC
HCT: 46.3 % (ref 39.0–52.0)
HCT: 51.7 % (ref 39.0–52.0)
Hemoglobin: 15.6 g/dL (ref 13.0–17.0)
Hemoglobin: 16.8 g/dL (ref 13.0–17.0)
MCV: 85 fL (ref 78.0–100.0)
Platelets: 324 10*3/uL (ref 150–400)
RBC: 5.62 MIL/uL (ref 4.22–5.81)
RBC: 6.07 MIL/uL — ABNORMAL HIGH (ref 4.22–5.81)
RDW: 13.6 % (ref 11.5–15.5)
WBC: 10 10*3/uL (ref 4.0–10.5)
WBC: 10.1 10*3/uL (ref 4.0–10.5)

## 2011-01-09 LAB — RAPID URINE DRUG SCREEN, HOSP PERFORMED
Amphetamines: NOT DETECTED
Barbiturates: NOT DETECTED
Benzodiazepines: NOT DETECTED
Opiates: NOT DETECTED

## 2011-01-09 LAB — PROTEIN, URINE, RANDOM: Total Protein, Urine: 20 mg/dL

## 2011-01-09 LAB — HEMOGLOBIN A1C: Mean Plasma Glucose: 367 mg/dL

## 2011-01-09 LAB — HEPATIC FUNCTION PANEL
ALT: 30 U/L (ref 0–53)
Albumin: 4.4 g/dL (ref 3.5–5.2)
Alkaline Phosphatase: 99 U/L (ref 39–117)
Indirect Bilirubin: 1.7 mg/dL — ABNORMAL HIGH (ref 0.3–0.9)
Total Protein: 7.9 g/dL (ref 6.0–8.3)

## 2011-01-09 LAB — CARDIAC PANEL(CRET KIN+CKTOT+MB+TROPI)
Relative Index: 0.4 (ref 0.0–2.5)
Relative Index: 0.5 (ref 0.0–2.5)

## 2011-01-09 LAB — CK TOTAL AND CKMB (NOT AT ARMC): Total CK: 128 U/L (ref 7–232)

## 2011-01-09 LAB — KETONES, QUALITATIVE

## 2011-01-09 LAB — SODIUM, URINE, RANDOM: Sodium, Ur: 19 mEq/L

## 2012-07-10 ENCOUNTER — Ambulatory Visit (INDEPENDENT_AMBULATORY_CARE_PROVIDER_SITE_OTHER): Payer: Self-pay | Admitting: Family Medicine

## 2012-07-10 ENCOUNTER — Encounter: Payer: Self-pay | Admitting: Family Medicine

## 2012-07-10 VITALS — BP 122/87 | HR 80 | Temp 98.1°F | Ht 71.0 in | Wt 261.0 lb

## 2012-07-10 DIAGNOSIS — E119 Type 2 diabetes mellitus without complications: Secondary | ICD-10-CM

## 2012-07-10 DIAGNOSIS — E785 Hyperlipidemia, unspecified: Secondary | ICD-10-CM

## 2012-07-10 DIAGNOSIS — E669 Obesity, unspecified: Secondary | ICD-10-CM

## 2012-07-10 NOTE — Patient Instructions (Addendum)
Keep log of blood sugars and bring back to next appointment  Take Lantus 35 units in morning and 30 units before bed  If you fasting blood sugar continues to be higher than 120, increase to 35 units in morning and 35 units at night  Keep your novolog the same  Make follow-up appointment for one month and we will get fasting blood work (nothing to eat but water and medicine for 8 hours)

## 2012-07-10 NOTE — Assessment & Plan Note (Signed)
Reports fasting glucose in 200-230 range with  Before lunch and before dinner at 260.  No hypoglycemia.  Will increase lantus from 60 qhs to 35 qam and 35 qpm, if no lows, will increase to 35 bid.  Will continue resistant SSI for now.  Will follow-up with log I gave him in 1 month for fasting labs, a1c and follow-up

## 2012-07-10 NOTE — Assessment & Plan Note (Signed)
Counseled on TLC- states he would like to be more physically active- difficulty to get specific goals- he plans on going to basketball court this weekend

## 2012-07-10 NOTE — Assessment & Plan Note (Signed)
Previously with increased LDL and triglycerides.  Will recheck lipids, wait on next visit restarting statin as he is reluctant, and we are making changes to his insulin regimen today.

## 2012-07-10 NOTE — Progress Notes (Signed)
Subjective:    Patient ID: Ian Huff, male    DOB: 1986/07/24, 26 y.o.   MRN: [id number]  HPI Here to establish primary care= was previously a patient at [facility].  Brings some records for review  DIABETES Thinks last a1c was 10 or 11? Taking and tolerating: Lantus 60 QHS + Novo log  + SSI TID with meals Fasting blood sugars:Fasting glucose 200's Hypoglycemic symptoms: no Visual problems: no Last eye exam:2010  Diabetic Labs:  Lab Results  Component Value Date   HGBA1C 9.2* 07/28/2010   HGBA1C 8.2* 04/27/2010   HGBA1C 14.4 11/18/2009   Lab Results  Component Value Date   MICROALBUR 0.50 04/27/2010   LDLCALC See Comment mg/dL 78/29/5621   CREATININE 1.11 04/27/2010   Last microalbumin: Lab Results  Component Value Date   MICROALBUR 0.50 04/27/2010   HYPERLIPIDEMIA Was previously on simvastatin, gemfibrozil, niacin per records- he reports not taking them due to preference. Diet: Not following low cholesterol diet Exercise: No regular exercise Wt Readings from Last 3 Encounters:  07/10/12 261 lb (118.389 kg)  07/28/10 270 lb 12.8 oz (122.834 kg)  04/27/10 272 lb 8 oz (123.605 kg)   ROS:  Denies RUQ pain, myalgias, or symptoms or coronary ischemia Lab Results  Component Value Date   LDLCALC See Comment mg/dL 30/86/5784   Lab Results  Component Value Date   CHOL 239* 08/12/2010   CHOL  Value: 620        ATP III CLASSIFICATION:  <200     mg/dL   Desirable  696-295  mg/dL   Borderline High  >=284    mg/dL   High       * 1/32/4401   Lab Results  Component Value Date   HDL 46 08/12/2010   HDL NOT REPORTED DUE TO HIGH TRIGLYCERIDES 11/17/2009   Lab Results  Component Value Date   TRIG 454* 08/12/2010   TRIG 1765 RESULTS CONFIRMED BY MANUAL DILUTION* 11/17/2009   Lab Results  Component Value Date   ALT 28 04/27/2010   AST 20 04/27/2010   ALKPHOS 56 04/27/2010   BILITOT 0.5 04/27/2010      Patient Information Form: Screening and ROS  AUDIT-C Score: 0 Do you  feel safe in relationships? yes PHQ-2:negative  Review of Symptoms  General:  Negative for nexplained weight loss, fever Skin: Negative for new or changing mole, sore that won't heal HEENT: Negative for trouble hearing, trouble seeing, ringing in ears, mouth sores, hoarseness, change in voice, dysphagia. CV:  Negative for chest pain, dyspnea, edema, palpitations Resp: Negative for cough, dyspnea, hemoptysis GI: Negative for nausea, vomiting, diarrhea, constipation, abdominal pain, melena, hematochezia. GU: Negative for dysuria, incontinence, urinary hesitance, hematuria, vaginal or penile discharge, polyuria, sexual difficulty, lumps in testicle or breasts MSK: Negative for muscle cramps or aches, joint pain or swelling Neuro: Negative for headaches, weakness, numbness, dizziness, passing out/fainting Psych: Negative for depression, anxiety, memory problems   Review of Systemssee HPI     Objective:   Physical Exam   GEN: Alert & Oriented, No acute distress HEENT: [place]/AT. EOMI, PERRLA, no conjunctival injection or scleral icterus.  Bilateral tympanic membranes intact without erythema or effusion.  .  Nares without edema or rhinorrhea.  Oropharynx is without erythema or exudates.  No anterior or posterior cervical lymphadenopathy. CV:  Regular Rate & Rhythm, no murmur Respiratory:  Normal work of breathing, CTAB Abd:  + BS, soft, no tenderness to palpation Ext: no pre-tibial edema  Assessment & Plan:

## 2012-07-21 ENCOUNTER — Emergency Department (HOSPITAL_COMMUNITY)
Admission: EM | Admit: 2012-07-21 | Discharge: 2012-07-21 | Disposition: A | Discharge status: Home / Self Care | Payer: Self-pay | Attending: Emergency Medicine | Admitting: Emergency Medicine

## 2012-07-21 ENCOUNTER — Encounter (HOSPITAL_COMMUNITY): Payer: Self-pay | Admitting: *Deleted

## 2012-07-21 ENCOUNTER — Emergency Department (HOSPITAL_COMMUNITY): Payer: Self-pay

## 2012-07-21 DIAGNOSIS — E785 Hyperlipidemia, unspecified: Secondary | ICD-10-CM | POA: Insufficient documentation

## 2012-07-21 DIAGNOSIS — M109 Gout, unspecified: Secondary | ICD-10-CM | POA: Insufficient documentation

## 2012-07-21 DIAGNOSIS — E119 Type 2 diabetes mellitus without complications: Secondary | ICD-10-CM | POA: Insufficient documentation

## 2012-07-21 DIAGNOSIS — E7800 Pure hypercholesterolemia, unspecified: Secondary | ICD-10-CM | POA: Insufficient documentation

## 2012-07-21 HISTORY — DX: Pure hypercholesterolemia, unspecified: E78.00

## 2012-07-21 MED ORDER — HYDROCODONE-ACETAMINOPHEN 5-325 MG PO TABS: 1.0000 | ORAL_TABLET | ORAL | Status: DC | PRN

## 2012-07-21 MED ORDER — PREDNISONE 20 MG PO TABS: 60.0000 mg | ORAL_TABLET | Freq: Every day | ORAL | Status: DC

## 2012-07-21 MED ORDER — INDOMETHACIN 25 MG PO CAPS: 25.0000 mg | ORAL_CAPSULE | Freq: Three times a day (TID) | ORAL | Status: DC | PRN

## 2012-07-21 NOTE — ED Notes (Addendum)
Pt c/o great toe pain on right foot that started 1 week prior. Denies trauma, sport activity, and new shoes. 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 Pt able to wiggle other toes, but has pain moving great toe. Skin intact, no breaks. Pt able to sense touch. Pain on palpation and movement, unable to bend great toe. Pt diabetic.

## 2012-07-21 NOTE — ED Provider Notes (Signed)
History     CSN: [id number]  Arrival date & time 07/21/12  0206   First MD Initiated Contact with Patient 07/21/12 [phone]      Chief Complaint  Patient presents with  . Foot Pain   HPI  History provided by the patient. Patient is a 26 year old male with history of hypertension, hyper lipidemia and diabetes who presents with complaints of right great toe pain and swelling. Patient states symptoms began suddenly one week ago. They have gradually increased. Pain is worse with any movements or pressure over patella area. Patient has been taking ibuprofen for the past 2 days with slight relief. Patient has also noticed some swelling slight redness around the joint of toe. He denies having similar symptoms previously. He denies any injury or trauma.    Past Medical History  Diagnosis Date  . Diabetes mellitus   . Hyperlipidemia   . Hypercholesteremia     History reviewed. No pertinent past surgical history.  History reviewed. No pertinent family history.  History  Substance Use Topics  . Smoking status: Not on file  . Smokeless tobacco: Not on file  . Alcohol Use: No      Review of Systems  Cardiovascular: Negative for leg swelling.  Musculoskeletal:       Right great toe swelling and pain  Neurological: Negative for weakness and numbness.    Allergies  Review of patient's allergies indicates no known allergies.  Home Medications  No current outpatient prescriptions on file.  BP 135/92  Pulse 95  Temp 98.3 F (36.8 C) (Oral)  Resp 18  SpO2 100%  Physical Exam  Nursing note and vitals reviewed. Constitutional: He appears well-developed and well-nourished. No distress.  HENT:  Head: Normocephalic.  Cardiovascular: Normal rate and regular rhythm.   Pulmonary/Chest: Effort normal and breath sounds normal. No respiratory distress. He has no wheezes. He has no rales.  Musculoskeletal: Normal range of motion.       Very mild swelling and erythema of right great toe.  There is tenderness over the MTP joint.  Neurological: He is alert.  Skin: Skin is warm.  Psychiatric: He has a normal mood and affect. His behavior is normal.    ED Course  Procedures   Dg Toe Great Right  07/21/2012  *RADIOLOGY REPORT*  Clinical Data: Right toe pain and swelling.  RIGHT GREAT TOE  Comparison: None.  Findings: Three views of the right first toe were obtained.  There is normal alignment without fracture or dislocation.  No evidence for cortical irregularity.  IMPRESSION: Negative examination.   Original Report Authenticated By: Richarda Overlie, M.D.      1. Gout       MDM  4:50 a.m. patient seen and evaluated. Patient no acute distress. X-rays unremarkable. Symptoms and exam consistent with gout.        Angus Seller, [place] 07/21/12 [phone]

## 2012-07-21 NOTE — ED Notes (Signed)
Pt c/o R great toe pain x 1 week. Pt states taking ibuprofen for pain x 2 days w/ slight relief.

## 2012-07-21 NOTE — ED Provider Notes (Signed)
Medical screening examination/treatment/procedure(s) were performed by non-physician practitioner and as supervising physician I was immediately available for consultation/collaboration.   Loren Racer, MD 07/21/12 [phone]

## 2012-07-25 ENCOUNTER — Ambulatory Visit (INDEPENDENT_AMBULATORY_CARE_PROVIDER_SITE_OTHER): Payer: Self-pay | Admitting: Family Medicine

## 2012-07-25 VITALS — BP 140/74 | HR 83 | Temp 97.6°F | Ht 71.0 in | Wt 262.0 lb

## 2012-07-25 DIAGNOSIS — E785 Hyperlipidemia, unspecified: Secondary | ICD-10-CM

## 2012-07-25 DIAGNOSIS — E119 Type 2 diabetes mellitus without complications: Secondary | ICD-10-CM

## 2012-07-25 DIAGNOSIS — M109 Gout, unspecified: Secondary | ICD-10-CM

## 2012-07-25 MED ORDER — PRAVASTATIN SODIUM 40 MG PO TABS: 40.0000 mg | ORAL_TABLET | Freq: Every day | ORAL | Status: DC

## 2012-07-25 NOTE — Patient Instructions (Addendum)
Increase lantus to 32 units twice a day  If blood sugars continue to stay above 150, can increase to 35 units twice a day  New medicine for cholesterol- pravastatin- start after you finish your gout medicine  I will refer you to diabetes education  Go to [facility] for orange card  Follow-up in 1 month

## 2012-07-25 NOTE — Progress Notes (Signed)
  Subjective:    Patient ID: Ian Huff, male    DOB: March 28, 1986, 26 y.o.   MRN: [id number]  HPIHere to follow-up dx of gout from ER  Has has right great toe pain for 1.5 weeks.  Noted no trauma.  acvute onset at waking up one more.  Never had a flare before.  Was seen in er 9/29 and rxed prednisone and indomethacin and hydrocodone.  Has not taken any of these medications.  Continues to have pain.  DM;  Brings in log today.  Takein lantus 30 bid and usuing hospital resistant SSI with novolog = 6 units with each meal + 121-150 = 3 units 151-200: 4 units 201-250: 7 units  No hypoglycmeia.  Review of Systems See HPI    Objective:   Physical Exam  GEN: Alert & Oriented, No acute distress Right foot:  Mild erythema and swelling over 1st MTP       Assessment & Plan:

## 2012-07-25 NOTE — Assessment & Plan Note (Signed)
Will increase continue to titrate lantus upward.  Patient interested in DM education- will refer once he gets orange card.  Follow-up in 1 month with glucose log.

## 2012-07-25 NOTE — Assessment & Plan Note (Signed)
Clinically consistent with gout.  Aspirate not attempted.  First episode.  Advised him to pick up NSAID and take to help resolve pain.  Will follow-up in 1 month

## 2012-07-25 NOTE — Assessment & Plan Note (Addendum)
Will start pravastatin today.  Recheck Lipid panel in 3 months

## 2012-08-06 ENCOUNTER — Ambulatory Visit: Payer: Self-pay | Admitting: Family Medicine

## 2013-02-04 ENCOUNTER — Encounter: Payer: Self-pay | Admitting: Family Medicine

## 2013-02-04 ENCOUNTER — Ambulatory Visit (INDEPENDENT_AMBULATORY_CARE_PROVIDER_SITE_OTHER): Payer: Self-pay | Admitting: Family Medicine

## 2013-02-04 DIAGNOSIS — M109 Gout, unspecified: Secondary | ICD-10-CM

## 2013-02-04 DIAGNOSIS — E785 Hyperlipidemia, unspecified: Secondary | ICD-10-CM

## 2013-02-04 DIAGNOSIS — E119 Type 2 diabetes mellitus without complications: Secondary | ICD-10-CM

## 2013-02-04 LAB — POCT GLYCOSYLATED HEMOGLOBIN (HGB A1C): Hemoglobin A1C: >14

## 2013-02-04 LAB — POCT UA - MICROALBUMIN: Microalbumin Ur, POC: 10 mg/dL

## 2013-02-04 MED ORDER — INSULIN ASPART 100 UNIT/ML ~~LOC~~ SOLN
6.0000 [IU] | Freq: Three times a day (TID) | SUBCUTANEOUS | Status: DC
Start: 2013-02-04 — End: 2013-03-11

## 2013-02-04 MED ORDER — INSULIN GLARGINE 100 UNIT/ML ~~LOC~~ SOLN: 32.0000 [IU] | Freq: Two times a day (BID) | SUBCUTANEOUS | Status: DC

## 2013-02-04 NOTE — Progress Notes (Signed)
  Subjective:    Patient ID: Ian Huff, male    DOB: Feb 11, 1986, 27 y.o.   MRN: [id number]  HPIHere for eye exam required by work and to follow-up on dm  DIABETES  Taking and tolerating: yes Lnatus 30 BID and novolog SSI on average uses 15 units with each meal, skips sometiems- later admits to not taking insulin regularly Fasting blood sugars:fasting 200's Hypoglycemic symptoms: no Visual problems: no Monitoring feet: yes Numbness/Tingling: no Last eye exam: 3 years Diabetic Labs:  Lab Results  Component Value Date   HGBA1C 9.2* 07/28/2010   HGBA1C 8.2* 04/27/2010   HGBA1C 14.4 11/18/2009   Lab Results  Component Value Date   MICROALBUR 0.50 04/27/2010   LDLCALC See Comment mg/dL 30/86/5784   CREATININE 1.11 04/27/2010   Last microalbumin: Lab Results  Component Value Date   MICROALBUR 0.50 04/27/2010    HYPERLIPIDEMIA  Diet: Not following low cholesterol diet Exercise: No regular exercise- walks dog sometimes Wt Readings from Last 3 Encounters:  07/25/12 262 lb (118.842 kg)  07/10/12 261 lb (118.389 kg)  07/28/10 270 lb 12.8 oz (122.834 kg)   ROS:  Denies RUQ pain, myalgias, or symptoms or coronary ischemia Lab Results  Component Value Date   LDLCALC See Comment mg/dL 69/62/9528   Lab Results  Component Value Date   CHOL 239* 08/12/2010   CHOL  Value: 620        ATP III CLASSIFICATION:  <200     mg/dL   Desirable  413-244  mg/dL   Borderline High  >=010    mg/dL   High       * 2/72/5366   Lab Results  Component Value Date   HDL 46 08/12/2010   HDL NOT REPORTED DUE TO HIGH TRIGLYCERIDES 11/17/2009   Lab Results  Component Value Date   TRIG 454* 08/12/2010   TRIG 1765 RESULTS CONFIRMED BY MANUAL DILUTION* 11/17/2009   Lab Results  Component Value Date   ALT 28 04/27/2010   AST 20 04/27/2010   ALKPHOS 56 04/27/2010   BILITOT 0.5 04/27/2010        Review of Systems See HPI    Objective:   Physical Exam GEN: Alert & Oriented, No acute distress CV:   Regular Rate & Rhythm, no murmur Respiratory:  Normal work of breathing, CTAB Abd:  + BS, soft, no tenderness to palpation Ext: no pre-tibial edema Foot:  Normal to inspection, normal pulses, normal monofilament       Assessment & Plan:

## 2013-02-04 NOTE — Assessment & Plan Note (Addendum)
Last seen 6 months ago.  A1c worse- now > 14.  Patient admits to problems with compliance.  No specific factors identified.  Gave glucose log, will keep log and restart all meds, follow-up in 2-3 week for further titration  Micro albumin normal  Today.  Would consider ACE-I if BP remains elevated at next visit. Retinal photo taken today  Refer to DM education

## 2013-02-04 NOTE — Patient Instructions (Addendum)
Follow-up in 2-3 weeks  Come fasting- only water for 8 hours and we will check your bloodwork

## 2013-02-04 NOTE — Assessment & Plan Note (Signed)
Not taking pravastatin.  Will restatr, get fastign labs at follow-up appointment

## 2013-02-04 NOTE — Assessment & Plan Note (Signed)
First time presumed Flare resolved.  Will check uric acid with next blood draw

## 2013-02-06 NOTE — Addendum Note (Signed)
Addended by: Macy Mis on: 02/06/2013 01:42 PM   Modules accepted: Orders

## 2013-02-10 ENCOUNTER — Encounter: Payer: Self-pay | Admitting: *Deleted

## 2013-02-11 ENCOUNTER — Encounter: Payer: Self-pay | Admitting: Family Medicine

## 2013-02-26 ENCOUNTER — Ambulatory Visit: Payer: Self-pay | Admitting: Dietician

## 2013-03-11 ENCOUNTER — Other Ambulatory Visit: Payer: Self-pay | Admitting: Family Medicine

## 2013-03-11 MED ORDER — INSULIN GLARGINE 100 UNIT/ML ~~LOC~~ SOLN: 32.0000 [IU] | Freq: Two times a day (BID) | SUBCUTANEOUS | Status: DC

## 2013-03-11 MED ORDER — PRAVASTATIN SODIUM 40 MG PO TABS: 40.0000 mg | ORAL_TABLET | Freq: Every day | ORAL | Status: DC

## 2013-03-11 MED ORDER — INSULIN ASPART 100 UNIT/ML ~~LOC~~ SOLN: 6.0000 [IU] | Freq: Three times a day (TID) | SUBCUTANEOUS | Status: DC

## 2013-03-27 ENCOUNTER — Encounter: Payer: Self-pay | Admitting: *Deleted

## 2013-03-27 ENCOUNTER — Encounter: Payer: No Typology Code available for payment source | Attending: Family Medicine | Admitting: *Deleted

## 2013-03-27 VITALS — Ht 71.0 in | Wt 252.7 lb

## 2013-03-27 DIAGNOSIS — E669 Obesity, unspecified: Secondary | ICD-10-CM | POA: Insufficient documentation

## 2013-03-27 DIAGNOSIS — E119 Type 2 diabetes mellitus without complications: Secondary | ICD-10-CM | POA: Insufficient documentation

## 2013-03-27 DIAGNOSIS — Z713 Dietary counseling and surveillance: Secondary | ICD-10-CM | POA: Insufficient documentation

## 2013-03-27 NOTE — Progress Notes (Signed)
  Medical Nutrition Therapy:  Appt start time: 1500 end time:  1600.  Assessment:  Primary concerns today: patient here for diabetes and obesity.  Lives alone. Works part time at [employer]. Work hours vary day to day. SMBG at least twice a day, stating range of 220 - 393 before meals. Gets thirsty and anxious when BG is too high. Enjoys listening to music and be on the computer. He states he misses his insulin doses often.  MEDICATIONS: see list, currently on Lantus and Novolog insulin   DIETARY INTAKE:  Usual eating pattern includes 2 meals and 1-2 snacks per day.  Everyday foods include fair variety of all food groups including some high fat foods.  Avoided foods include: none stated.    24-hr recall:  B ( AM): skips usually, gives correction dose though if BG too high  Snk ( AM): occasionally crackers with cheese @ 1-2 pkgs  L ( PM): eats out- baked chicken at [facility], with potato wedges, diet Coke or water Snk ( PM): chips out of the bag D ( PM): cooks himself- meat, potatoes, occasionally a vegetable or bread, water or low calorie juice Snk ( PM): popcorn Beverages: diet Coke or water, or low calorie juice  Usual physical activity: not lately  Estimated energy needs: 2400 calories 270 g carbohydrates 180 g protein 67 g fat  Progress Towards Goal(s):  In progress.   Nutritional Diagnosis:  NB-1.1 Food and nutrition-related knowledge deficit As related to diabetes management.  As evidenced by A1c of over 14% on 02/04/2013.    Intervention:  Nutrition counseling and diabetes education initiated. Discussed basic physiology of diabetes, SMBG and rationale of checking BG at alternate times of day, A1c, Carb Counting and reading food labels, and benefits of increased activity. Also discussed insulin briefly and mentioned suggestion of trying a V-go insulin delivery device to make insulin management simpler for him. Not sure if [facility] will cover it, will check with rep to see.  Plan to discuss insulin in more detail at next visit.  Plan:  Aim for 5 Carb Choices per meal (75 grams) +/- 1 either way  Aim for 0-2 Carbs per snack if hungry  Consider reading food labels for Total Carbohydrate of foods Continue checking BG at alternate times per day as directed by MD  Consider taking insulin more consistently each day as directed by MD  Handouts given during visit include: Living Well with Diabetes Carb Counting and Food Label handouts Meal Plan Card  Monitoring/Evaluation:  Dietary intake, exercise, reading food labels, and body weight prn. Patient to call for next appointment.

## 2013-03-27 NOTE — Patient Instructions (Signed)
Plan:  Aim for 5 Carb Choices per meal (75 grams) +/- 1 either way  Aim for 0-2 Carbs per snack if hungry  Consider reading food labels for Total Carbohydrate of foods Continue checking BG at alternate times per day as directed by MD  Consider taking insulin more consistently each day as directed by MD

## 2013-04-18 ENCOUNTER — Other Ambulatory Visit: Payer: Self-pay | Admitting: Family Medicine

## 2013-04-18 MED ORDER — INSULIN GLARGINE 100 UNIT/ML ~~LOC~~ SOLN: 40.0000 [IU] | Freq: Two times a day (BID) | SUBCUTANEOUS | Status: DC

## 2013-04-22 ENCOUNTER — Telehealth: Payer: Self-pay | Admitting: *Deleted

## 2013-04-22 NOTE — Telephone Encounter (Signed)
LM on identifiable VM asking pt to call me regarding his insulin dosage.  Also, LM asking pt to schedule OV with new PCP and to bring all medications to this visit.  Irisha Grandmaison, Darlyne Russian, CMA

## 2013-04-23 ENCOUNTER — Telehealth: Payer: Self-pay | Admitting: *Deleted

## 2013-04-23 NOTE — Telephone Encounter (Signed)
Attempted to reach pt again regarding his current DM medications and his sliding scale.   Hendrick Pavich, Darlyne Russian, CMA

## 2013-04-24 ENCOUNTER — Encounter: Payer: Self-pay | Admitting: Family Medicine

## 2013-04-24 ENCOUNTER — Ambulatory Visit (INDEPENDENT_AMBULATORY_CARE_PROVIDER_SITE_OTHER): Payer: No Typology Code available for payment source | Admitting: Family Medicine

## 2013-04-24 VITALS — BP 140/78 | HR 81 | Temp 98.2°F | Ht 71.0 in | Wt 250.0 lb

## 2013-04-24 DIAGNOSIS — E785 Hyperlipidemia, unspecified: Secondary | ICD-10-CM

## 2013-04-24 DIAGNOSIS — R030 Elevated blood-pressure reading, without diagnosis of hypertension: Secondary | ICD-10-CM

## 2013-04-24 DIAGNOSIS — E119 Type 2 diabetes mellitus without complications: Secondary | ICD-10-CM

## 2013-04-24 NOTE — Patient Instructions (Addendum)
Diabetes and Standards of Medical Care  Diabetes is complicated. You may find that your diabetes team includes a dietitian, nurse, diabetes educator, eye doctor, and more. To help everyone know what is going on and to help you get the care you deserve, the following schedule of care was developed to help keep you on track. Below are the tests, exams, vaccines, medicines, education, and plans you will need. A1c test  Performed at least 2 times a year if you are meeting treatment goals.  Performed 4 times a year if therapy has changed or if you are not meeting treatment goals. Blood pressure test  Performed at every routine medical visit. The goal is less than 120/80 mmHg. Dental exam  Follow up with the dentist regularly. Eye exam  Diagnosed with type 1 diabetes as a child: Get an exam upon reaching the age of 10 years or older and having had diabetes for 3 5 years. Yearly eye exams are recommended after that initial eye exam.  Diagnosed with type 1 diabetes as an adult: Get an exam within 5 years of diagnosis and then yearly.  Diagnosed with type 2 diabetes: Get an exam as soon as possible after the diagnosis and then yearly. Foot care exam  Visual foot exams are performed at every routine medical visit. The exams check for cuts, injuries, or other problems with the feet.  A comprehensive foot exam should be done yearly. This includes visual inspection as well as assessing foot pulses and testing for loss of sensation. Kidney function test (urine microalbumin)  Performed once a year.  Type 1 diabetes: The first test is performed 5 years after diagnosis.  Type 2 diabetes: The first test is performed at the time of diagnosis.  A serum creatinine and estimated glomerular filtration rate (eGFR) test is done once a year to tell the level of chronic kidney disease (CKD), if present. Lipid profile (Cholesterol, HDL, LDL, Triglycerides)  Performed every 5 years for most people.  The  goal for LDL is less than 100 mg/dl. If at high risk, the goal is less than 70 mg/dl.  The goal for HDL is 40 mg/dl 50 mg/dl for men and 50 mg/dl 60 mg/dl for women. An HDL cholesterol of 60 mg/dL or higher gives some protection against heart disease.  The goal for triglycerides is less than 150 mg/dl. Influenza vaccine, pneumococcal vaccine, and hepatitis B vaccine  The influenza vaccine is recommended yearly.  The pneumococcal vaccine is generally given once in a lifetime. However, there are some instances when another vaccination is recommended. Check with your caregiver.  The hepatitis B vaccine is also recommended for adults with diabetes. Diabetes self-management education  Recommended at diagnosis and ongoing as needed. Treatment plan  Reviewed at every medical visit. Document Released: 08/06/2009 Document Revised: 09/25/2012 Document Reviewed: 04/11/2011 [employer] Patient Information 2014 [employer].  

## 2013-04-24 NOTE — Progress Notes (Signed)
Subjective:     Patient ID: Ian Huff, male   DOB: 1986-03-30, 27 y.o.   MRN: [id number]  Diabetes He presents for his follow-up diabetic visit.  He is currently on Lantus 32 unit BID in addition to hospital resistant sliding scale in addition to 6 unit Novolog before each meals,he has been compliant with his medications,his home capillary glucose usually measure 125-180,he was seen by a nutritionist in June and he is compliant with his follow up,he denies any complaint today. Elevated WU:JWJX for follow up,denies any concern. [id number] with Pravachol,no medication S/E.  Current Outpatient Prescriptions on File Prior to Visit  Medication Sig Dispense Refill  . insulin aspart (NOVOLOG) 100 UNIT/ML injection Inject 6-26 Units into the skin 3 (three) times daily before meals.  1 vial  5  . pravastatin (PRAVACHOL) 40 MG tablet Take 1 tablet (40 mg total) by mouth daily.  30 tablet  5   No current facility-administered medications on file prior to visit.   Past Medical History  Diagnosis Date  . Diabetes mellitus   . Hyperlipidemia   . Hypercholesteremia   . Obesity      Review of Systems  Respiratory: Negative.   Cardiovascular: Negative.   Gastrointestinal: Negative.   Genitourinary: Negative.   All other systems reviewed and are negative.   Filed Vitals:   04/24/13 1520  BP: 140/78  Pulse: 81  Temp: 98.2 F (36.8 C)  TempSrc: Oral  Height: 5\' 11"  (1.803 m)  Weight: 250 lb (113.399 kg)       Objective:   Physical Exam  Nursing note and vitals reviewed. Constitutional: He appears well-developed. No distress.  Cardiovascular: Normal rate, regular rhythm, normal heart sounds and intact distal pulses.   No murmur heard. Pulmonary/Chest: Effort normal and breath sounds normal. No respiratory distress. He has no wheezes. He exhibits no tenderness.  Abdominal: Soft. Bowel sounds are normal. He exhibits no distension and no mass. There is no tenderness.   Musculoskeletal: Normal range of motion. He exhibits no edema and no tenderness.       Assessment/Plan:    DM 2 High Blood pressure Hyperlipidemia

## 2013-04-24 NOTE — Assessment & Plan Note (Signed)
BP not optimal for his BP./ I recommended Lisinopril,he is resistant to starting a new medication at this time,he will think about it and discuss at next visit. COntinue diet and exercise control.

## 2013-04-24 NOTE — Assessment & Plan Note (Signed)
Return for FLP. COntinue Pravachol,diet and exercise control.

## 2013-04-24 NOTE — Assessment & Plan Note (Addendum)
Patient is compliant with his treatment. I agree with his current regimen,continue Lantus 32 unit BID plus Novolog sliding scale +6 units with each meal. Continue home glucose monitor Return in 3 months for A1C. A1C checked today improved from the last checked 2 1/2 months ago.Capillary Gluc is 166.

## 2013-07-31 ENCOUNTER — Other Ambulatory Visit: Payer: Self-pay | Admitting: Family Medicine

## 2013-07-31 MED ORDER — INSULIN ASPART 100 UNIT/ML ~~LOC~~ SOLN: 6.0000 [IU] | Freq: Three times a day (TID) | SUBCUTANEOUS | Status: DC

## 2013-09-10 ENCOUNTER — Other Ambulatory Visit: Payer: Self-pay | Admitting: Family Medicine

## 2013-09-10 MED ORDER — INSULIN GLARGINE 100 UNIT/ML ~~LOC~~ SOLN: 32.0000 [IU] | Freq: Two times a day (BID) | SUBCUTANEOUS | Status: DC

## 2013-10-30 ENCOUNTER — Other Ambulatory Visit: Payer: Self-pay | Admitting: Family Medicine

## 2013-10-30 ENCOUNTER — Telehealth: Payer: Self-pay | Admitting: Family Medicine

## 2013-10-30 MED ORDER — INSULIN ASPART 100 UNIT/ML ~~LOC~~ SOLN: 6.0000 [IU] | Freq: Three times a day (TID) | SUBCUTANEOUS | Status: AC

## 2013-10-30 MED ORDER — INSULIN GLARGINE 100 UNIT/ML ~~LOC~~ SOLN: 32.0000 [IU] | Freq: Two times a day (BID) | SUBCUTANEOUS | Status: AC

## 2013-10-30 MED ORDER — PRAVASTATIN SODIUM 40 MG PO TABS: 40.0000 mg | ORAL_TABLET | Freq: Every day | ORAL | Status: DC

## 2013-10-30 NOTE — Telephone Encounter (Signed)
Completed .Refill done

## 2013-10-30 NOTE — Telephone Encounter (Signed)
Pt states that he gets his pravastatin at [facility].  Can this be resent?  Elyn Krogh,CMA

## 2013-10-30 NOTE — Telephone Encounter (Signed)
Medication refill done,script was printed and placed in front office for faxing today.

## 2013-10-30 NOTE — Telephone Encounter (Signed)
Refill request for Novolog, Lantus & Pravastatin

## 2013-10-31 ENCOUNTER — Other Ambulatory Visit: Payer: Self-pay | Admitting: Family Medicine

## 2013-10-31 MED ORDER — PRAVASTATIN SODIUM 40 MG PO TABS: 40.0000 mg | ORAL_TABLET | Freq: Every day | ORAL | Status: DC

## 2013-10-31 NOTE — Telephone Encounter (Signed)
Pt is aware of this. Ian Huff,CMA  

## 2013-10-31 NOTE — Telephone Encounter (Signed)
Pravachol sent to [facility]

## 2013-11-03 ENCOUNTER — Telehealth: Payer: Self-pay | Admitting: Family Medicine

## 2013-11-03 MED ORDER — PRAVASTATIN SODIUM 40 MG PO TABS: 40.0000 mg | ORAL_TABLET | Freq: Every day | ORAL | Status: AC

## 2013-11-03 NOTE — Telephone Encounter (Signed)
Still needs pravachol Shows it was printed and to be faxed. I have looked in the faxes done 10-31-13 and those done today.  Cannot find it  Please advise

## 2013-11-03 NOTE — Telephone Encounter (Signed)
I E-scribed Pravachol to [facility] and [facility]ed Insulin to [facility] last week.

## 2013-11-03 NOTE — Telephone Encounter (Signed)
Did you print this medication and fax it to [facility]?  Asah Lamay,CMA

## 2013-11-03 NOTE — Telephone Encounter (Signed)
Refill printed and placed in the fax box up front. If it is missing then Olegario MessierKathy needs to know about it. I am pretty certain I left it up front for faxing.

## 2013-11-03 NOTE — Telephone Encounter (Signed)
Resent medication to [facility]. The faxed rx was from 10-30-13. Ramces Shomaker,CMA

## 2014-01-09 IMAGING — CR DG TOE GREAT 2+V*R*
3 series · 3 of 3 positions shown · non-contrast
Comparison: None.

CLINICAL DATA: Right toe pain and swelling.

RIGHT GREAT TOE

[x toes ap right]
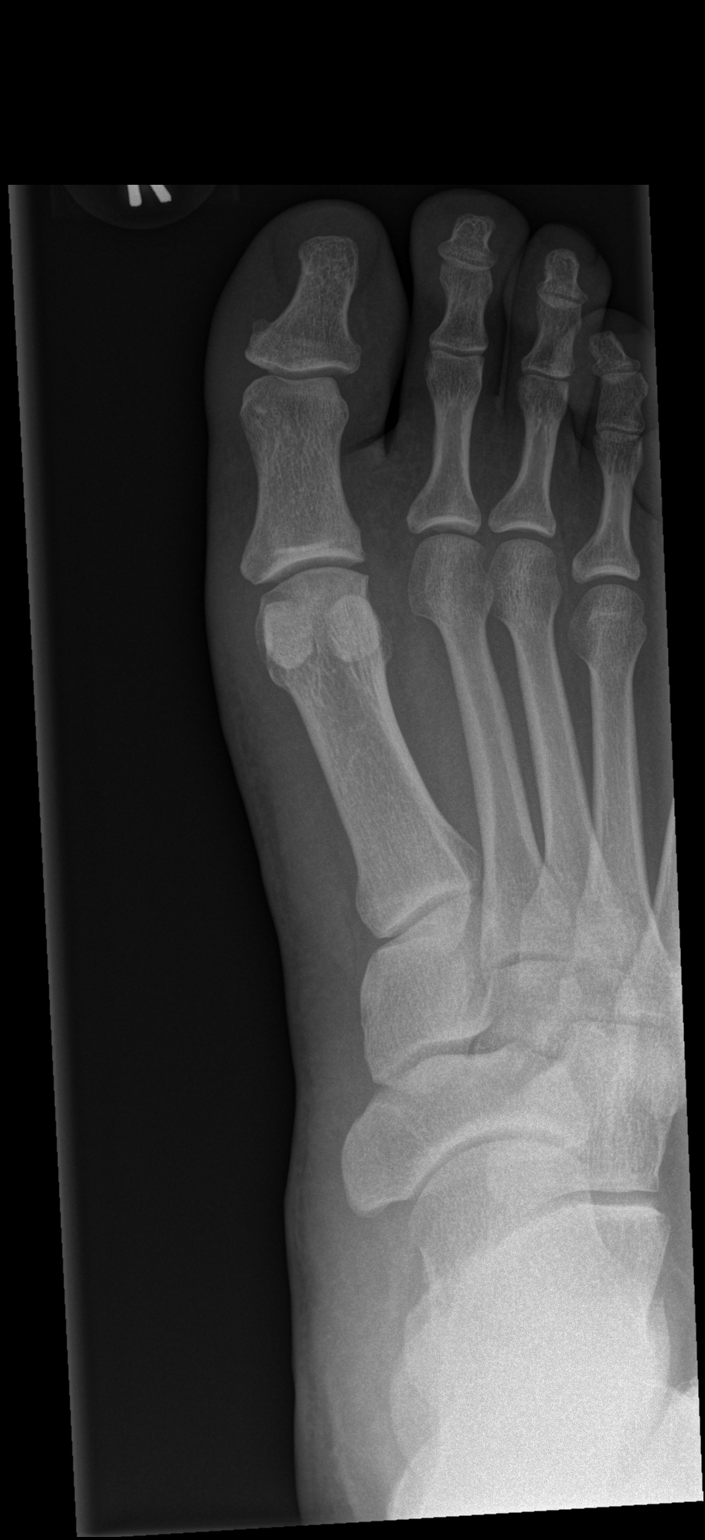

[x toes obl right]
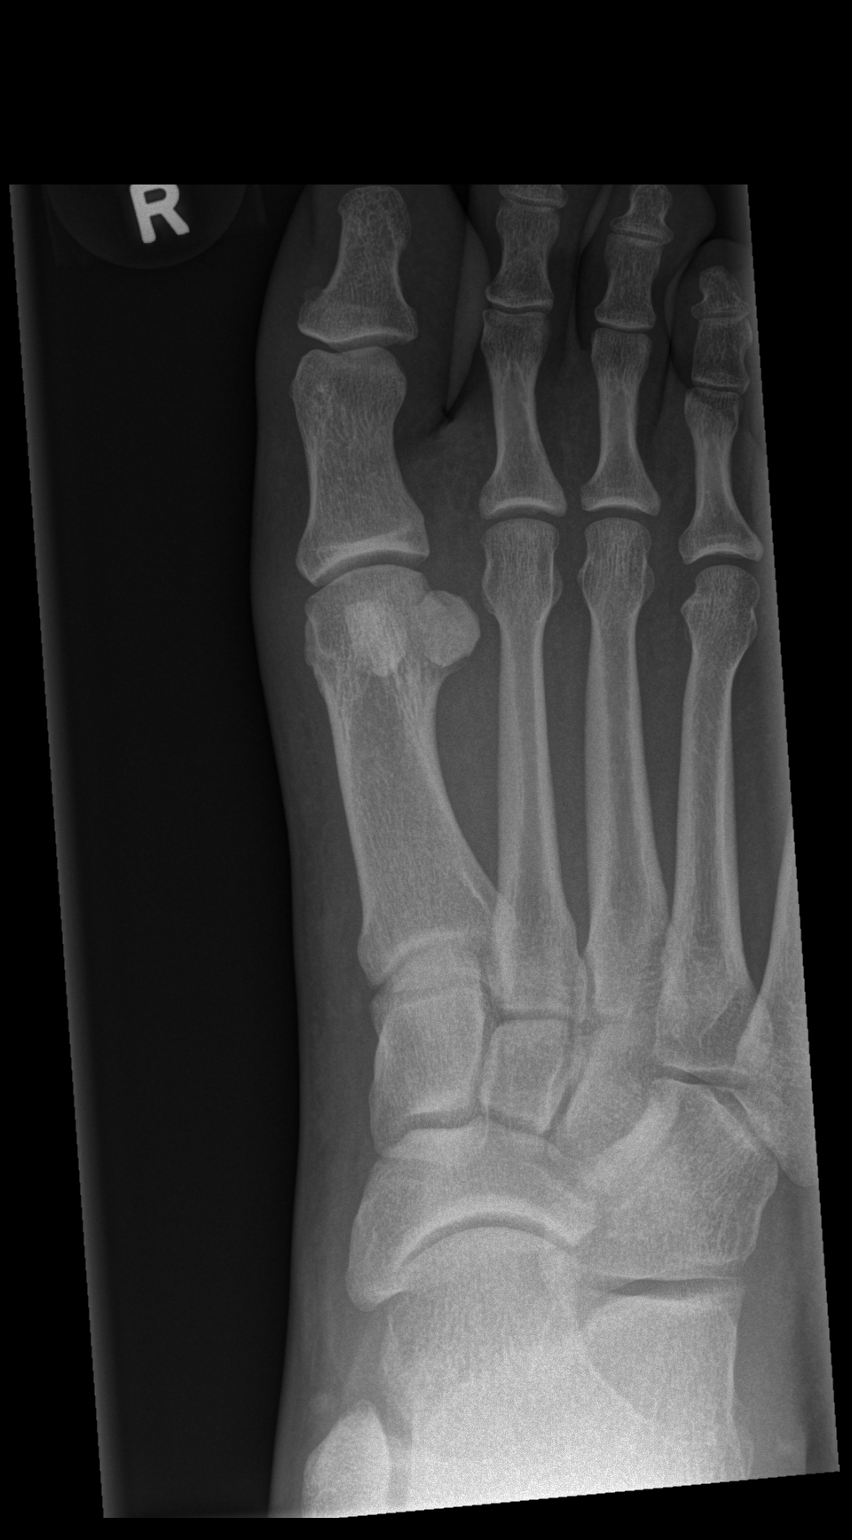

[x toes lat right]
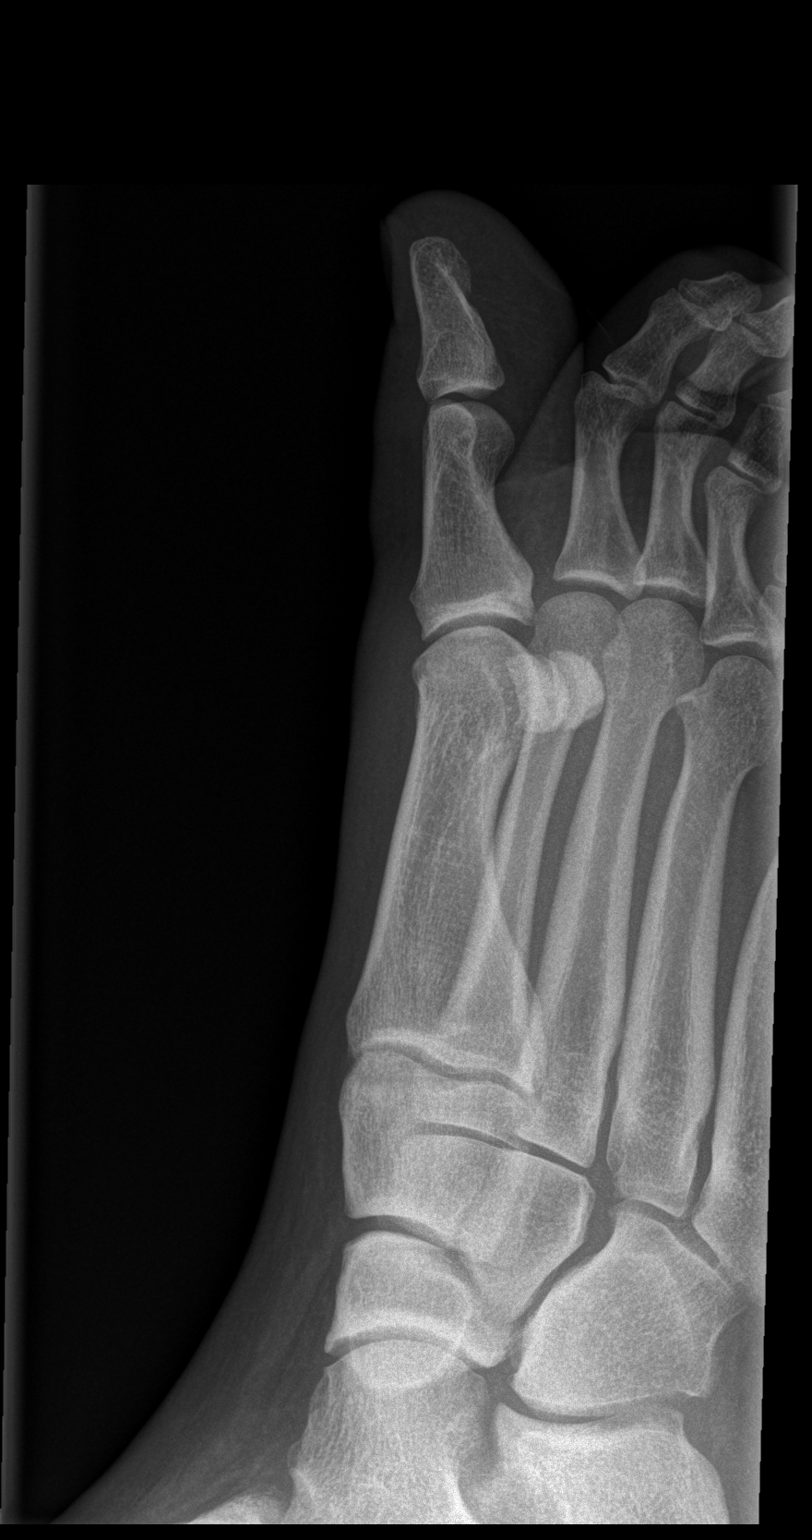

[3 of 3 positions shown; findings below may reference images not displayed]

FINDINGS: Three views of the right first toe were obtained.  There
is normal alignment without fracture or dislocation.  No evidence
for cortical irregularity.
IMPRESSION: Negative examination.

## 2015-01-05 ENCOUNTER — Telehealth: Payer: Self-pay | Admitting: *Deleted

## 2015-01-05 NOTE — Telephone Encounter (Signed)
Left message on voicemail for patient to call back and set up appointment for DM management.
